# Patient Record
Sex: Female | Born: 2000 | Race: Black or African American | Hispanic: No | Marital: Single | Smoking: Never smoker
Health system: Southern US, Community
[De-identification: ages and names within clinical notes are randomized; demographics above are authoritative.]

---
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
## 2022-01-21 ENCOUNTER — Other Ambulatory Visit: Payer: Self-pay

## 2022-01-21 ENCOUNTER — Ambulatory Visit (HOSPITAL_COMMUNITY)
Admission: EM | Admit: 2022-01-21 | Discharge: 2022-01-21 | Disposition: A | Discharge status: Home / Self Care | Payer: Medicaid Other | Attending: Physician Assistant | Admitting: Physician Assistant

## 2022-01-21 ENCOUNTER — Encounter (HOSPITAL_COMMUNITY): Payer: Self-pay | Admitting: Emergency Medicine

## 2022-01-21 DIAGNOSIS — R42 Dizziness and giddiness: Secondary | ICD-10-CM | POA: Diagnosis not present

## 2022-01-21 DIAGNOSIS — E162 Hypoglycemia, unspecified: Secondary | ICD-10-CM

## 2022-01-21 LAB — CBC
HCT: 36.7 % (ref 36.0–46.0)
Hemoglobin: 11.5 g/dL — ABNORMAL LOW (ref 12.0–15.0)
MCH: 26.9 pg (ref 26.0–34.0)
MCHC: 31.3 g/dL (ref 30.0–36.0)
MCV: 85.7 fL (ref 80.0–100.0)
Platelets: 250 10*3/uL (ref 150–400)
RBC: 4.28 MIL/uL (ref 3.87–5.11)
RDW: 13.7 % (ref 11.5–15.5)
WBC: 2.4 10*3/uL — ABNORMAL LOW (ref 4.0–10.5)
nRBC: 0 % (ref 0.0–0.2)

## 2022-01-21 LAB — POCT URINALYSIS DIPSTICK, ED / UC
Bilirubin Urine: NEGATIVE
Glucose, UA: NEGATIVE mg/dL
Hgb urine dipstick: NEGATIVE
Ketones, ur: NEGATIVE mg/dL
Leukocytes,Ua: NEGATIVE
Nitrite: NEGATIVE
Protein, ur: NEGATIVE mg/dL
Specific Gravity, Urine: 1.025 (ref 1.005–1.030)
Urobilinogen, UA: 0.2 mg/dL (ref 0.0–1.0)
pH: 6 (ref 5.0–8.0)

## 2022-01-21 LAB — COMPREHENSIVE METABOLIC PANEL
ALT: 11 U/L (ref 0–44)
AST: 17 U/L (ref 15–41)
Albumin: 4.1 g/dL (ref 3.5–5.0)
Alkaline Phosphatase: 35 U/L — ABNORMAL LOW (ref 38–126)
Anion gap: 6 (ref 5–15)
BUN: 9 mg/dL (ref 6–20)
CO2: 23 mmol/L (ref 22–32)
Calcium: 9.2 mg/dL (ref 8.9–10.3)
Chloride: 108 mmol/L (ref 98–111)
Creatinine, Ser: 0.76 mg/dL (ref 0.44–1.00)
GFR, Estimated: >60 mL/min (ref >60)
Glucose, Bld: 93 mg/dL (ref 70–99)
Potassium: 4.2 mmol/L (ref 3.5–5.1)
Sodium: 137 mmol/L (ref 135–145)
Total Bilirubin: 0.8 mg/dL (ref 0.3–1.2)
Total Protein: 7.3 g/dL (ref 6.5–8.1)

## 2022-01-21 LAB — CBG MONITORING, ED
Glucose-Capillary: 68 mg/dL — ABNORMAL LOW (ref 70–99)
Glucose-Capillary: 81 mg/dL (ref 70–99)

## 2022-01-21 LAB — POC URINE PREG, ED: Preg Test, Ur: NEGATIVE

## 2022-01-21 MED ORDER — GLUCOSE 4 G PO CHEW
CHEWABLE_TABLET | ORAL | Status: AC
  Filled 2022-01-21: qty 4

## 2022-01-21 MED ORDER — GLUCOSE 4 G PO CHEW
4.0000 | CHEWABLE_TABLET | Freq: Once | ORAL | Status: AC
  Administered 2022-01-21: 16 g via ORAL

## 2022-01-21 NOTE — ED Triage Notes (Signed)
Patient c/o dizzy spells that's been going on for years.  Recently it has increased.  In the past, patient states that she normally drinks orange juice if having a dizzy spell and she felt better.  Patient denies a history of DM, but it runs in her family.  BS checked yesterday by BF mom, it was 105 an hour after eating.

## 2022-01-21 NOTE — Discharge Instructions (Signed)
I believe that your symptoms are likely related to several things.  You had an elevation in your heart rate when you went from sitting to standing which could be contributing to your symptoms.  I recommend you follow-up with cardiology.  Your blood sugar was also low despite having had a normal breakfast.  Please make sure you are eating small frequent meals throughout the day.  I have given you the contact information for endocrinology I would like you to call to schedule an appointment.  If you have any worsening symptoms including passing out, chest pain, shortness of breath, nausea/vomiting, weakness you need to go to the emergency room.  I will contact you with your lab work results once I have them.

## 2022-01-21 NOTE — ED Provider Notes (Addendum)
[facility]    CSN: [id number] Arrival date & time: 01/21/22  1022      History   Chief Complaint Chief Complaint  Patient presents with   Dizziness    HPI Ann Horton is a 21 y.o. female.   Patient presents today with a prolonged history of intermittent lightheadedness that has worsened significantly in the past several weeks.  She reports the frequency and severity of episodes have increased.  She does report 1 syncopal episode several months ago but generally has not had any associated syncope.  Reports that she often feels lightheaded and so she is going to pass out with associated warm sensation and nausea.  She does report that not eating enough will often trigger symptoms but she has recently continued to have symptoms despite increasing oral intake.  She denies any medication changes.  She denies history of hypoglycemia, diabetes, arrhythmia.  She does have heavy menstrual bleeding with LMP 12/30/2021.  Reports that for the first 2 days she has to change her personal hygiene product every 1-2 hours.  Denies additional blood loss including melena or hematochezia.  Denies formal diagnosis of anemia.  She denies associated shortness of breath, chest pain, palpitations.  She is having difficulty with daily activities as a result of symptoms.    History reviewed. No pertinent past medical history.  There are no problems to display for this patient.   History reviewed. No pertinent surgical history.  OB History   No obstetric history on file.      Home Medications    Prior to Admission medications   Not on File    Family History Family History  Family history unknown: Yes    Social History Social History   Tobacco Use   Smoking status: Never   Smokeless tobacco: Never  Vaping Use   Vaping Use: Never used  Substance Use Topics   Alcohol use: Never   Drug use: Never     Allergies   Patient has no known allergies.   Review of Systems Review of  Systems  Constitutional:  Positive for activity change. Negative for appetite change, fatigue and fever.  Respiratory:  Negative for cough and shortness of breath.   Cardiovascular:  Negative for chest pain, palpitations and leg swelling.  Gastrointestinal:  Negative for abdominal pain, diarrhea, nausea and vomiting.  Neurological:  Positive for light-headedness. Negative for dizziness, syncope, facial asymmetry, speech difficulty, weakness, numbness and headaches.     Physical Exam Triage Vital Signs ED Triage Vitals  Enc Vitals Group     BP 01/21/22 1126 114/75     Pulse Rate 01/21/22 1126 67     Resp 01/21/22 1126 18     Temp 01/21/22 1126 98 F (36.7 C)     Temp Source 01/21/22 1126 Oral     SpO2 01/21/22 1126 96 %     Weight 01/21/22 1129 123 lb (55.8 kg)     Height 01/21/22 1129 5\' 6"  (1.676 m)     Head Circumference --      Peak Flow --      Pain Score 01/21/22 1129 0     Pain Loc --      Pain Edu? --      Excl. in GC? --    Orthostatic VS for the past 24 hrs:  BP- Lying Pulse- Lying BP- Sitting Pulse- Sitting BP- Standing at 0 minutes Pulse- Standing at 0 minutes  01/21/22 1234 107/67 73 106/71 73 123/83 112  Updated Vital Signs BP 114/75 (BP Location: Left Arm)   Pulse 67   Temp 98 F (36.7 C) (Oral)   Resp 18   Ht 5\' 6"  (1.676 m)   Wt 123 lb (55.8 kg)   LMP 12/30/2021 (Exact Date)   SpO2 96%   BMI 19.85 kg/m   Visual Acuity Right Eye Distance:   Left Eye Distance:   Bilateral Distance:    Right Eye Near:   Left Eye Near:    Bilateral Near:     Physical Exam Vitals reviewed.  Constitutional:      General: She is awake. She is not in acute distress.    Appearance: Normal appearance. She is well-developed. She is not ill-appearing.     Comments: Very pleasant female appears stated age in no acute distress sitting comfortably in exam room  HENT:     Head: Normocephalic and atraumatic. No raccoon eyes, Battle's sign or contusion.     Right Ear:  Ear canal and external ear normal. There is impacted cerumen.     Left Ear: Ear canal and external ear normal. There is impacted cerumen.     Ears:     Comments: Cerumen impaction noted bilaterally.  Able to visualize approximately 20% of TM that appears normal.    Nose: Nose normal.     Mouth/Throat:     Tongue: Tongue does not deviate from midline.     Pharynx: Uvula midline. No oropharyngeal exudate or posterior oropharyngeal erythema.  Eyes:     Extraocular Movements: Extraocular movements intact.     Conjunctiva/sclera: Conjunctivae normal.     Pupils: Pupils are equal, round, and reactive to light.  Cardiovascular:     Rate and Rhythm: Normal rate and regular rhythm.     Heart sounds: Normal heart sounds, S1 normal and S2 normal. No murmur heard. Pulmonary:     Effort: Pulmonary effort is normal.     Breath sounds: Normal breath sounds. No wheezing, rhonchi or rales.     Comments: Clear to auscultation bilaterally Abdominal:     General: Bowel sounds are normal.     Palpations: Abdomen is soft.     Tenderness: There is no abdominal tenderness.  Musculoskeletal:     Cervical back: Normal range of motion and neck supple. No spinous process tenderness or muscular tenderness.     Comments: Strength 5/5 bilateral upper and lower extremities  Lymphadenopathy:     Head:     Right side of head: No submental, submandibular or tonsillar adenopathy.     Left side of head: No submental, submandibular or tonsillar adenopathy.  Neurological:     General: No focal deficit present.     Mental Status: She is alert and oriented to person, place, and time.     Cranial Nerves: Cranial nerves 2-12 are intact.     Motor: Motor function is intact.     Coordination: Coordination is intact. Romberg sign negative.     Gait: Gait is intact.     Comments: Cranial nerves II through XII intact no focal neurological defect on exam.  Psychiatric:        Behavior: Behavior is cooperative.      UC  Treatments / Results  Labs (all labs ordered are listed, but only abnormal results are displayed) Labs Reviewed  CBC - Abnormal; Notable for the following components:      Result Value   WBC 2.4 (*)    Hemoglobin 11.5 (*)    All other  components within normal limits  CBG MONITORING, ED - Abnormal; Notable for the following components:   Glucose-Capillary 68 (*)    All other components within normal limits  COMPREHENSIVE METABOLIC PANEL  INSULIN, RANDOM  POCT URINALYSIS DIPSTICK, ED / UC  POC URINE PREG, ED  CBG MONITORING, ED    EKG   Radiology No results found.  Procedures Procedures (including critical care time)  Medications Ordered in UC Medications  glucose chewable tablet 16 g (16 g Oral Given 01/21/22 1233)    Initial Impression / Assessment and Plan / UC Course  I have reviewed the triage vital signs and the nursing notes.  Pertinent labs & imaging results that were available during my care of the patient were reviewed by me and considered in my medical decision making (see chart for details).     EKG was obtained that showed normal sinus rhythm with sinus arrhythmia with ventricular rate of 87 bpm without ischemic changes; no previous to compare.  Orthostatic vital signs did show significant elevation in heart rate between sitting and standing.  We had discussed that POTS could be contributing to symptoms recommend she follow-up with cardiology.  She was given contact information for local provider with instruction to call to schedule an appointment.  She was noted to be mildly hypoglycemic despite having had a large breakfast.  Discussed that this could be contributing to symptoms.  She was given glucose tablets with normalization of her blood sugar.  Insulin was added to her blood work and recommended that she follow-up with endocrinology as recurrent hypoglycemia may be contributing to symptoms.  She was encouraged to eat small frequent meals and drink plenty of  fluid.  Basic labs including CBC and CMP were obtained today-results pending.  She is to rest and drink plenty of fluid.  Discussed that if she has any worsening symptoms including syncopal episode, lightheadedness, chest pain, shortness of breath she needs to be seen immediately to which she expressed understanding.  Final Clinical Impressions(s) / UC Diagnoses   Final diagnoses:  Episodic lightheadedness  Hypoglycemia     Discharge Instructions      I believe that your symptoms are likely related to several things.  You had an elevation in your heart rate when you went from sitting to standing which could be contributing to your symptoms.  I recommend you follow-up with cardiology.  Your blood sugar was also low despite having had a normal breakfast.  Please make sure you are eating small frequent meals throughout the day.  I have given you the contact information for endocrinology I would like you to call to schedule an appointment.  If you have any worsening symptoms including passing out, chest pain, shortness of breath, nausea/vomiting, weakness you need to go to the emergency room.  I will contact you with your lab work results once I have them.     ED Prescriptions   None    PDMP not reviewed this encounter.   Jeani Hawking, PA-C 01/21/22 1319    Verley Pariseau K, PA-C 01/21/22 1326

## 2022-01-22 LAB — INSULIN, RANDOM: Insulin: 36.9 u[IU]/mL — ABNORMAL HIGH (ref 2.6–24.9)

## 2023-12-15 ENCOUNTER — Encounter: Payer: Self-pay | Admitting: *Deleted

## 2023-12-15 ENCOUNTER — Ambulatory Visit
Admission: EM | Admit: 2023-12-15 | Discharge: 2023-12-15 | Disposition: A | Discharge status: Home / Self Care | Attending: Nurse Practitioner | Admitting: Nurse Practitioner

## 2023-12-15 DIAGNOSIS — H6121 Impacted cerumen, right ear: Secondary | ICD-10-CM

## 2023-12-15 NOTE — ED Provider Notes (Signed)
 [facility]    CSN: [id number] Arrival date & time: 12/15/23  1031      History   Chief Complaint Chief Complaint  Patient presents with   Ear Fullness    HPI Ann Horton is a 23 y.o. female.   The history is provided by the patient.   Patient presents for complaints of right ear pain that started 2 days ago.  Patient states that she used peroxide after the ear pain started along with eardrops for ear pain.  She states that the ear pain has since resolved but now she feels that the right ear is clogged.  She states she can hear out of the right ear, but sounds are muffled.  Denies fever, chills, ear drainage, headache, dizziness, or recent upper respiratory symptoms.  Denies prior history of cerumen impaction. History reviewed. No pertinent past medical history.  There are no active problems to display for this patient.   History reviewed. No pertinent surgical history.  OB History   No obstetric history on file.      Home Medications    Prior to Admission medications   Not on File    Family History Family History  Family history unknown: Yes    Social History Social History   Tobacco Use   Smoking status: Never   Smokeless tobacco: Never  Vaping Use   Vaping status: Never Used  Substance Use Topics   Alcohol use: Never   Drug use: Yes    Types: Marijuana     Allergies   Patient has no known allergies.   Review of Systems Review of Systems Per HPI  Physical Exam Triage Vital Signs ED Triage Vitals  Encounter Vitals Group     BP 12/15/23 1039 114/73     Systolic BP Percentile --      Diastolic BP Percentile --      Pulse Rate 12/15/23 1039 74     Resp 12/15/23 1039 16     Temp 12/15/23 1039 98.3 F (36.8 C)     Temp Source 12/15/23 1039 Oral     SpO2 12/15/23 1039 98 %     Weight --      Height --      Head Circumference --      Peak Flow --      Pain Score 12/15/23 1038 0     Pain Loc --      Pain Education --       Exclude from Growth Chart --    No data found.  Updated Vital Signs BP 114/73 (BP Location: Right Arm)   Pulse 74   Temp 98.3 F (36.8 C) (Oral)   Resp 16   LMP 12/15/2023 (Exact Date)   SpO2 98%   Visual Acuity Right Eye Distance:   Left Eye Distance:   Bilateral Distance:    Right Eye Near:   Left Eye Near:    Bilateral Near:     Physical Exam Vitals and nursing note reviewed.  Constitutional:      General: She is not in acute distress.    Appearance: Normal appearance.  HENT:     Head: Normocephalic.     Right Ear: Ear canal and external ear normal. There is impacted cerumen.     Left Ear: Tympanic membrane, ear canal and external ear normal.     Ears:     Comments: Ear irrigation of the right ear was performed.  Post irrigation, patient with complete removal of  cerumen.  Patient tolerated the procedure well.    Nose: Nose normal.     Mouth/Throat:     Mouth: Mucous membranes are moist.  Eyes:     Extraocular Movements: Extraocular movements intact.     Pupils: Pupils are equal, round, and reactive to light.  Pulmonary:     Effort: Pulmonary effort is normal.  Musculoskeletal:     Cervical back: Normal range of motion.  Skin:    General: Skin is warm and dry.  Neurological:     General: No focal deficit present.     Mental Status: She is alert and oriented to person, place, and time.  Psychiatric:        Mood and Affect: Mood normal.        Behavior: Behavior normal.      UC Treatments / Results  Labs (all labs ordered are listed, but only abnormal results are displayed) Labs Reviewed - No data to display  EKG   Radiology No results found.  Procedures Procedures (including critical care time)  Medications Ordered in UC Medications - No data to display  Initial Impression / Assessment and Plan / UC Course  I have reviewed the triage vital signs and the nursing notes.  Pertinent labs & imaging results that were available during my care of  the patient were reviewed by me and considered in my medical decision making (see chart for details).  Patient presents with cerumen impaction of the right ear.  Ear irrigation was performed with removal of cerumen obstruction.  Discussion with patient regarding use of Debrox earwax softener to prevent further cerumen impaction.  Supportive care recommendations were provided and discussed with the patient to include use of over-the-counter analgesics, use of Debrox earwax softener, and avoiding sticking anything inside of the ear while symptoms persist.  Patient was in agreement with this plan of care and verbalized understanding.  All questions were answered.  Patient stable for discharge.   Final Clinical Impressions(s) / UC Diagnoses   Final diagnoses:  None   Discharge Instructions   None    ED Prescriptions   None    PDMP not reviewed this encounter.   Hardy Lia, NP 12/15/23 1121

## 2023-12-15 NOTE — ED Triage Notes (Signed)
 Pt states that 2 days ago she started having right ear pain, she used peroxide and OTC ear pain relief now she has a right clogged ear but she has no pain.

## 2023-12-15 NOTE — Discharge Instructions (Addendum)
Patient declined AVS
